# Patient Record
Sex: Male | Born: 1995 | Race: White | Hispanic: No | Marital: Married | Smoking: Never smoker
Health system: Southern US, Community
[De-identification: ages and names within clinical notes are randomized; demographics above are authoritative.]

## PROBLEM LIST (undated history)

## (undated) DIAGNOSIS — S82899A Other fracture of unspecified lower leg, initial encounter for closed fracture: Secondary | ICD-10-CM

---
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
## 2021-06-04 ENCOUNTER — Ambulatory Visit
Admission: EM | Admit: 2021-06-04 | Discharge: 2021-06-04 | Disposition: A | Discharge status: Home / Self Care | Payer: Medicaid Other | Attending: Family Medicine | Admitting: Family Medicine

## 2021-06-04 ENCOUNTER — Encounter: Payer: Self-pay | Admitting: Emergency Medicine

## 2021-06-04 DIAGNOSIS — J029 Acute pharyngitis, unspecified: Secondary | ICD-10-CM

## 2021-06-04 NOTE — ED Provider Notes (Signed)
  [facility]   [id number] 06/04/21 Arrival Time: 0805  ASSESSMENT & PLAN:  1. Sore throat    Improved. Work note provided.   Follow-up Information     [facility].   Specialty: Urgent Care Why: As needed. Contact information: [address], [address] [place] [address] [phone]                Reviewed expectations re: course of current medical issues. Questions answered. Outlined signs and symptoms indicating need for more acute intervention. Understanding verbalized. After Visit Summary given.   SUBJECTIVE: History from: patient. Jonathan Michael is a 25 y.o. male who reports ST 1-2 days, now better. No fever or cough. Needs note in order to return to work.     OBJECTIVE:  Vitals:   06/04/21 0825  BP: 128/79  Pulse: 69  Resp: 18  Temp: 97.6 F (36.4 C)  TempSrc: Oral  SpO2: 95%    General appearance: alert; no distress Eyes: PERRLA; EOMI; conjunctiva normal HENT: [place]; AT; without nasal congestion; no throat erythema; normal swallowing Neck: supple  Lungs: speaks full sentences without difficulty; unlabored Extremities: no edema Skin: warm and dry Neurologic: normal gait Psychological: alert and cooperative; normal mood and affect   No Known Allergies  Past Medical History:  Diagnosis Date   Ankle fracture    left   Social History   Socioeconomic History   Marital status: Single    Spouse name: Not on file   Number of children: Not on file   Years of education: Not on file   Highest education level: Not on file  Occupational History   Not on file  Tobacco Use   Smoking status: Never   Smokeless tobacco: Never  Substance and Sexual Activity   Alcohol use: Never   Drug use: Never   Sexual activity: Not on file  Other Topics Concern   Not on file  Social History Narrative   Not on file   Social Determinants of Health   Financial Resource Strain: Not on file   Food Insecurity: Not on file  Transportation Needs: Not on file  Physical Activity: Not on file  Stress: Not on file  Social Connections: Not on file  Intimate Partner Violence: Not on file   No family history on file. History reviewed. No pertinent surgical history.   Mardella Layman, MD 06/04/21 [phone]

## 2021-06-04 NOTE — ED Triage Notes (Signed)
Pt had to called out of work on Monday d/t Scratchy throat and throat pain with cough. Symptoms are gone today but pt needs note for work.

## 2022-09-02 ENCOUNTER — Ambulatory Visit
Admission: RE | Admit: 2022-09-02 | Discharge: 2022-09-02 | Disposition: A | Discharge status: Home / Self Care | Payer: BC Managed Care – PPO | Source: Ambulatory Visit | Attending: Nurse Practitioner | Admitting: Nurse Practitioner

## 2022-09-02 VITALS — BP 129/89 | HR 98 | Temp 97.8°F | Resp 18

## 2022-09-02 DIAGNOSIS — H9201 Otalgia, right ear: Secondary | ICD-10-CM | POA: Insufficient documentation

## 2022-09-02 DIAGNOSIS — Z20822 Contact with and (suspected) exposure to covid-19: Secondary | ICD-10-CM | POA: Diagnosis present

## 2022-09-02 DIAGNOSIS — H66011 Acute suppurative otitis media with spontaneous rupture of ear drum, right ear: Secondary | ICD-10-CM | POA: Diagnosis present

## 2022-09-02 MED ORDER — AMOXICILLIN 875 MG PO TABS: 875.0000 mg | ORAL_TABLET | Freq: Two times a day (BID) | ORAL | 0 refills | Status: AC

## 2022-09-02 NOTE — Discharge Instructions (Signed)
You have an ear infection.  Please take the amoxicillin 875 mg twice daily for 5 days to treat the ear infection.  Since the ear drum is open, avoid getting any water in your ear until it heals.  Follow-up with your primary doctor if your hearing does not improve after you finish treatment.

## 2022-09-02 NOTE — ED Triage Notes (Signed)
Nasal congestion, cough 3 days ago, right ear ringing since last night.  Has been taking sudafed

## 2022-09-02 NOTE — ED Provider Notes (Signed)
[facility]    CSN: [id number] Arrival date & time: 09/02/22  1602      History   Chief Complaint Chief Complaint  Patient presents with   Cough    ear pain - Entered by patient    HPI Jonathan Michael is a 26 y.o. male.   Patient presents for 3 days of dry cough, sore throat, sinus pressure, decreased appetite, and fatigue.  Reports last night, he developed severe right-sided ear pain that has eased off a little bit now.  Denies any ear drainage.  Reports both of his children are also sick.  Denies any significant fever, bodyaches, chills, shortness of breath or wheezing, chest pain or tightness, chest or nasal congestion, postnasal drainage, headache, abdominal pain, nausea/vomiting, diarrhea, and new rash.  Reports he took Sudafed for the past couple of days which did help with his symptoms temporarily.     Past Medical History:  Diagnosis Date   Ankle fracture    left    There are no problems to display for this patient.   History reviewed. No pertinent surgical history.     Home Medications    Prior to Admission medications   Medication Sig Start Date End Date Taking? Authorizing Provider  amoxicillin (AMOXIL) 875 MG tablet Take 1 tablet (875 mg total) by mouth 2 (two) times daily for 5 days. 09/02/22 09/07/22 Yes Valentino Nose, NP    Family History History reviewed. No pertinent family history.  Social History Social History   Tobacco Use   Smoking status: Never   Smokeless tobacco: Never  Substance Use Topics   Alcohol use: Never   Drug use: Never     Allergies   Patient has no known allergies.   Review of Systems Review of Systems Per HPI  Physical Exam Triage Vital Signs ED Triage Vitals  Enc Vitals Group     BP 09/02/22 1615 129/89     Pulse Rate 09/02/22 1615 98     Resp 09/02/22 1615 18     Temp 09/02/22 1615 97.8 F (36.6 C)     Temp Source 09/02/22 1615 Oral     SpO2 09/02/22 1615 97 %     Weight --       Height --      Head Circumference --      Peak Flow --      Pain Score 09/02/22 1616 0     Pain Loc --      Pain Edu? --      Excl. in [place]? --    No data found.  Updated Vital Signs BP 129/89 (BP Location: Right Arm)   Pulse 98   Temp 97.8 F (36.6 C) (Oral)   Resp 18   SpO2 97%   Visual Acuity Right Eye Distance:   Left Eye Distance:   Bilateral Distance:    Right Eye Near:   Left Eye Near:    Bilateral Near:     Physical Exam Vitals and nursing note reviewed.  Constitutional:      General: He is not in acute distress.    Appearance: Normal appearance. He is not toxic-appearing.  HENT:     Head: Normocephalic and atraumatic.     Right Ear: Decreased hearing noted. Drainage present. No swelling or tenderness. Tympanic membrane is injected, perforated, erythematous and bulging.     Left Ear: Hearing, tympanic membrane, ear canal and external ear normal. No decreased hearing noted. No drainage, swelling or tenderness.  Tympanic membrane is not injected, perforated, erythematous or bulging.     Nose: Nose normal. No congestion or rhinorrhea.     Mouth/Throat:     Mouth: Mucous membranes are moist.     Pharynx: Oropharynx is clear. Posterior oropharyngeal erythema present.  Eyes:     General: No scleral icterus.    Extraocular Movements: Extraocular movements intact.  Cardiovascular:     Rate and Rhythm: Normal rate and regular rhythm.  Pulmonary:     Effort: Pulmonary effort is normal. No respiratory distress.     Breath sounds: Normal breath sounds. No wheezing, rhonchi or rales.  Abdominal:     General: Abdomen is flat. Bowel sounds are normal. There is no distension.     Palpations: Abdomen is soft.     Tenderness: There is no abdominal tenderness. There is no right CVA tenderness or left CVA tenderness.  Musculoskeletal:     Cervical back: Normal range of motion.  Lymphadenopathy:     Cervical: No cervical adenopathy.  Skin:    General: Skin is warm and dry.      Capillary Refill: Capillary refill takes less than 2 seconds.     Coloration: Skin is not jaundiced or pale.     Findings: No erythema.  Neurological:     Mental Status: He is alert and oriented to person, place, and time.  Psychiatric:        Behavior: Behavior is cooperative.      UC Treatments / Results  Labs (all labs ordered are listed, but only abnormal results are displayed) Labs Reviewed  SARS CORONAVIRUS 2 (TAT 6-24 HRS)    EKG   Radiology No results found.  Procedures Procedures (including critical care time)  Medications Ordered in UC Medications - No data to display  Initial Impression / Assessment and Plan / UC Course  I have reviewed the triage vital signs and the nursing notes.  Pertinent labs & imaging results that were available during my care of the patient were reviewed by me and considered in my medical decision making (see chart for details).    Patient is well-appearing, normotensive, afebrile, not tachycardic, not tachypneic, oxygenating well on room air.  COVID-19 testing obtained, supportive care discussed.  On examination, he has otitis media of the right ear with tympanic membrane perforation.  We will treat with amoxicillin 870 mg twice daily for 5 days.  Supportive care discussed.  Ear precautions discussed.  Return and ER precautions discussed.  Encouraged follow-up if hearing does not improve with treatment.  Note given for work.  The patient was given the opportunity to ask questions.  All questions answered to their satisfaction.  The patient is in agreement to this plan.   Final Clinical Impressions(s) / UC Diagnoses   Final diagnoses:  Exposure to COVID-19 virus  Right ear pain  Non-recurrent acute suppurative otitis media of right ear with spontaneous rupture of tympanic membrane     Discharge Instructions      You have an ear infection.  Please take the amoxicillin 875 mg twice daily for 5 days to treat the ear infection.  Since  the ear drum is open, avoid getting any water in your ear until it heals.  Follow-up with your primary doctor if your hearing does not improve after you finish treatment.     ED Prescriptions     Medication Sig Dispense Auth. Provider   amoxicillin (AMOXIL) 875 MG tablet Take 1 tablet (875 mg total) by mouth 2 (  two) times daily for 5 days. 10 tablet Valentino Nose, NP      PDMP not reviewed this encounter.   Valentino Nose, NP 09/02/22 1652

## 2022-09-03 LAB — SARS CORONAVIRUS 2 (TAT 6-24 HRS): SARS Coronavirus 2: NEGATIVE

## 2022-10-20 ENCOUNTER — Ambulatory Visit
Admission: RE | Admit: 2022-10-20 | Discharge: 2022-10-20 | Disposition: A | Discharge status: Home / Self Care | Payer: BC Managed Care – PPO | Source: Ambulatory Visit | Attending: Nurse Practitioner | Admitting: Nurse Practitioner

## 2022-10-20 VITALS — BP 144/85 | HR 101 | Temp 98.9°F | Resp 18

## 2022-10-20 DIAGNOSIS — J069 Acute upper respiratory infection, unspecified: Secondary | ICD-10-CM | POA: Diagnosis present

## 2022-10-20 DIAGNOSIS — H6591 Unspecified nonsuppurative otitis media, right ear: Secondary | ICD-10-CM

## 2022-10-20 DIAGNOSIS — Z1152 Encounter for screening for COVID-19: Secondary | ICD-10-CM | POA: Diagnosis present

## 2022-10-20 LAB — RESP PANEL BY RT-PCR (FLU A&B, COVID) ARPGX2
Influenza A by PCR: NEGATIVE
Influenza B by PCR: NEGATIVE
SARS Coronavirus 2 by RT PCR: NEGATIVE

## 2022-10-20 MED ORDER — PROMETHAZINE-DM 6.25-15 MG/5ML PO SYRP: 5.0000 mL | ORAL_SOLUTION | Freq: Every evening | ORAL | 0 refills | Status: DC | PRN

## 2022-10-20 MED ORDER — FLUTICASONE PROPIONATE 50 MCG/ACT NA SUSP: 1.0000 | Freq: Every day | NASAL | 0 refills | Status: DC

## 2022-10-20 NOTE — Discharge Instructions (Signed)
You have a viral upper respiratory infection.  This should improve over the next week or so.  If your symptoms last longer than 10 days and do not improve, please follow-up for further evaluation.  If you develop chest pain or shortness of breath, go to the emergency room.  We have tested you today for COVID-19 and influenza you will see the results in Mychart and we will call you with positive results.    Please stay home and isolate until you are aware of the results.    Some things that can make you feel better are: - Increased rest - Increasing fluid with water/sugar free electrolytes - Acetaminophen and ibuprofen as needed for fever/pain - Salt water gargling, chloraseptic spray and throat lozenges - OTC guaifenesin (Mucinex) 600 mg twice daily  - Saline sinus flushes or a neti pot - Humidifying the air - Cough syrup at night time for dry cough  For the popping sensation in your right ear, start Flonase.

## 2022-10-20 NOTE — ED Triage Notes (Signed)
Chills and fevers since Friday night and haven't gotten better. I have a cough and never ending head ache and constantly cold. I belive have the flu but want to get checked out for covid and flu per patient.

## 2022-10-20 NOTE — ED Provider Notes (Signed)
[facility]    CSN: [id number] Arrival date & time: 10/20/22  [id number]      History   Chief Complaint Chief Complaint  Patient presents with   Cough    I have had chills and fevers since Friday night and haven't gotten better. I have a cough and never ending head ache and constantly cold. I belive I have the flu but want to get checked out for covid and flu - Entered by patient    HPI Jonathan Michael is a 26 y.o. male.   Patient presents for 3 days of chills, night sweats, mostly dry cough, nasal congestion, sore throat from coughing, sinus pressure, headache that is worse when coughing, right ear pressure, diarrhea, decreased appetite, fatigue, and weakness.  He denies shortness of breath, chest pain, chest congestion, runny nose, postnasal drainage, ear pain, nausea, vomiting, and abdominal pain.  No loss of taste or smell, no new rash.  Has tried increasing hydration, TheraFlu with minimal relief.    Past Medical History:  Diagnosis Date   Ankle fracture    left    There are no problems to display for this patient.   History reviewed. No pertinent surgical history.     Home Medications    Prior to Admission medications   Medication Sig Start Date End Date Taking? Authorizing Provider  fluticasone (FLONASE) 50 MCG/ACT nasal spray Place 1 spray into both nostrils daily. 10/20/22  Yes Eulogio Bear, NP  promethazine-dextromethorphan (PROMETHAZINE-DM) 6.25-15 MG/5ML syrup Take 5 mLs by mouth at bedtime as needed for cough. Do not take with alcohol or while driving or operating heavy machinery.  May cause drowsiness. 10/20/22  Yes Eulogio Bear, NP    Family History History reviewed. No pertinent family history.  Social History Social History   Tobacco Use   Smoking status: Never   Smokeless tobacco: Never  Substance Use Topics   Alcohol use: Never   Drug use: Never     Allergies   Patient has no known allergies.   Review of  Systems Review of Systems Per HPI  Physical Exam Triage Vital Signs ED Triage Vitals [10/20/22 1044]  Enc Vitals Group     BP (!) 144/85     Pulse Rate (!) 101     Resp 18     Temp 98.9 F (37.2 C)     Temp Source Oral     SpO2 96 %     Weight      Height      Head Circumference      Peak Flow      Pain Score 5     Pain Loc      Pain Edu?      Excl. in [place]?    No data found.  Updated Vital Signs BP (!) 144/85 (BP Location: Right Arm)   Pulse (!) 101   Temp 98.9 F (37.2 C) (Oral)   Resp 18   SpO2 96%   Visual Acuity Right Eye Distance:   Left Eye Distance:   Bilateral Distance:    Right Eye Near:   Left Eye Near:    Bilateral Near:     Physical Exam Vitals and nursing note reviewed.  Constitutional:      General: He is not in acute distress.    Appearance: Normal appearance. He is not ill-appearing or toxic-appearing.  HENT:     Head: Normocephalic and atraumatic.     Right Ear: Ear canal and external  ear normal. A middle ear effusion is present.     Left Ear: Tympanic membrane, ear canal and external ear normal.     Nose: No congestion or rhinorrhea.     Mouth/Throat:     Mouth: Mucous membranes are moist.     Pharynx: Oropharynx is clear. No oropharyngeal exudate or posterior oropharyngeal erythema.  Eyes:     General: No scleral icterus.    Extraocular Movements: Extraocular movements intact.  Cardiovascular:     Rate and Rhythm: Normal rate and regular rhythm.     Heart sounds: Normal heart sounds.  Pulmonary:     Effort: Pulmonary effort is normal. No respiratory distress.     Breath sounds: Normal breath sounds. No wheezing, rhonchi or rales.  Abdominal:     General: Abdomen is flat. Bowel sounds are normal. There is no distension.     Palpations: Abdomen is soft.     Tenderness: There is no abdominal tenderness. There is no guarding.  Musculoskeletal:     Cervical back: Normal range of motion and neck supple.  Lymphadenopathy:      Cervical: No cervical adenopathy.  Skin:    General: Skin is warm and dry.     Coloration: Skin is not jaundiced or pale.     Findings: No erythema or rash.  Neurological:     Mental Status: He is alert and oriented to person, place, and time.  Psychiatric:        Behavior: Behavior is cooperative.      UC Treatments / Results  Labs (all labs ordered are listed, but only abnormal results are displayed) Labs Reviewed  RESP PANEL BY RT-PCR (FLU A&B, COVID) [id number]    EKG   Radiology No results found.  Procedures Procedures (including critical care time)  Medications Ordered in UC Medications - No data to display  Initial Impression / Assessment and Plan / UC Course  I have reviewed the triage vital signs and the nursing notes.  Pertinent labs & imaging results that were available during my care of the patient were reviewed by me and considered in my medical decision making (see chart for details).   Patient is well-appearing, afebrile, not tachypneic, oxygenating well on room air.  He is mildly hypertensive and slightly tachycardic in triage, likely secondary to acute illness.  Encounter for screening for COVID-19 Viral URI with cough COVID-19, influenza testing obtained Supportive care discussed Start cough suppressants ER, return precautions discussed Note given for work  Middle ear effusion, right Start Flonase, recently had ruptured TM of same ear - suspect this will likely improve over next few weeks Consider ENT with no improvement  The patient was given the opportunity to ask questions.  All questions answered to their satisfaction.  The patient is in agreement to this plan.    Final Clinical Impressions(s) / UC Diagnoses   Final diagnoses:  Encounter for screening for COVID-19  Viral URI with cough  Middle ear effusion, right     Discharge Instructions      You have a viral upper respiratory infection.  This should improve over the next week or so.   If your symptoms last longer than 10 days and do not improve, please follow-up for further evaluation.  If you develop chest pain or shortness of breath, go to the emergency room.  We have tested you today for COVID-19 and influenza you will see the results in Mychart and we will call you with positive results.  Please stay home and isolate until you are aware of the results.    Some things that can make you feel better are: - Increased rest - Increasing fluid with water/sugar free electrolytes - Acetaminophen and ibuprofen as needed for fever/pain - Salt water gargling, chloraseptic spray and throat lozenges - OTC guaifenesin (Mucinex) 600 mg twice daily  - Saline sinus flushes or a neti pot - Humidifying the air - Cough syrup at night time for dry cough  For the popping sensation in your right ear, start Flonase.     ED Prescriptions     Medication Sig Dispense Auth. Provider   fluticasone (FLONASE) 50 MCG/ACT nasal spray Place 1 spray into both nostrils daily. 16 g Noemi Chapel A, NP   promethazine-dextromethorphan (PROMETHAZINE-DM) 6.25-15 MG/5ML syrup Take 5 mLs by mouth at bedtime as needed for cough. Do not take with alcohol or while driving or operating heavy machinery.  May cause drowsiness. 118 mL Eulogio Bear, NP      PDMP not reviewed this encounter.   Eulogio Bear, NP 10/20/22 1131

## 2022-10-26 ENCOUNTER — Ambulatory Visit: Payer: Self-pay

## 2022-10-26 ENCOUNTER — Ambulatory Visit
Admission: EM | Admit: 2022-10-26 | Discharge: 2022-10-26 | Disposition: A | Discharge status: Home / Self Care | Payer: BC Managed Care – PPO | Attending: Family Medicine | Admitting: Family Medicine

## 2022-10-26 DIAGNOSIS — J22 Unspecified acute lower respiratory infection: Secondary | ICD-10-CM

## 2022-10-26 MED ORDER — PROMETHAZINE-DM 6.25-15 MG/5ML PO SYRP: 5.0000 mL | ORAL_SOLUTION | Freq: Every evening | ORAL | 0 refills | Status: AC | PRN

## 2022-10-26 MED ORDER — ALBUTEROL SULFATE HFA 108 (90 BASE) MCG/ACT IN AERS: 2.0000 | INHALATION_SPRAY | RESPIRATORY_TRACT | 0 refills | Status: DC | PRN

## 2022-10-26 MED ORDER — AZITHROMYCIN 250 MG PO TABS: ORAL_TABLET | ORAL | 0 refills | Status: DC

## 2022-10-26 NOTE — ED Provider Notes (Signed)
[facility]    CSN: [id number] Arrival date & time: 10/26/22  1249      History   Chief Complaint No chief complaint on file.   HPI Jonathan Michael is a 26 y.o. male.   Presenting today following up on visit from 1 week ago for viral upper respiratory infection.  States his symptoms are significantly worsening over the past few days and his cough is persistent and he feels rattling in his chest.  Painful to swallow and is having nausea and vomiting additionally.  Taking Tylenol Cold and sinus now that his cough syrup is run out with minimal relief.  COVID flu testing was negative a week ago.    Past Medical History:  Diagnosis Date   Ankle fracture    left    There are no problems to display for this patient.   History reviewed. No pertinent surgical history.     Home Medications    Prior to Admission medications   Medication Sig Start Date End Date Taking? Authorizing Provider  albuterol (VENTOLIN HFA) 108 (90 Base) MCG/ACT inhaler Inhale 2 puffs into the lungs every 4 (four) hours as needed for wheezing or shortness of breath. 10/26/22  Yes Particia Nearing, PA-C  azithromycin (ZITHROMAX) 250 MG tablet Take first 2 tablets together, then 1 every day until finished. 10/26/22  Yes Particia Nearing, PA-C  fluticasone [facility]) 50 MCG/ACT nasal spray Place 1 spray into both nostrils daily. 10/20/22   Valentino Nose, NP  promethazine-dextromethorphan (PROMETHAZINE-DM) 6.25-15 MG/5ML syrup Take 5 mLs by mouth at bedtime as needed for cough. Do not take with alcohol or while driving or operating heavy machinery.  May cause drowsiness. 10/26/22   Particia Nearing, PA-C    Family History History reviewed. No pertinent family history.  Social History Social History   Tobacco Use   Smoking status: Never   Smokeless tobacco: Never  Substance Use Topics   Alcohol use: Never   Drug use: Never     Allergies   Patient has no known  allergies.   Review of Systems Review of Systems PER HPI  Physical Exam Triage Vital Signs ED Triage Vitals  Enc Vitals Group     BP 10/26/22 1311 (!) 142/84     Pulse Rate 10/26/22 1311 (!) 104     Resp 10/26/22 1311 20     Temp 10/26/22 1311 97.9 F (36.6 C)     Temp Source 10/26/22 1311 Oral     SpO2 10/26/22 1311 95 %     Weight --      Height --      Head Circumference --      Peak Flow --      Pain Score 10/26/22 1316 0     Pain Loc --      Pain Edu? --      Excl. in GC? --    No data found.  Updated Vital Signs BP (!) 142/84 (BP Location: Right Arm)   Pulse (!) 104   Temp 97.9 F (36.6 C) (Oral)   Resp 20   SpO2 95%   Visual Acuity Right Eye Distance:   Left Eye Distance:   Bilateral Distance:    Right Eye Near:   Left Eye Near:    Bilateral Near:     Physical Exam Vitals and nursing note reviewed.  Constitutional:      Appearance: He is well-developed.  HENT:     Head: Atraumatic.  Right Ear: External ear normal.     Left Ear: External ear normal.     Nose: Rhinorrhea present.     Mouth/Throat:     Pharynx: Posterior oropharyngeal erythema present. No oropharyngeal exudate.  Eyes:     Conjunctiva/sclera: Conjunctivae normal.     Pupils: Pupils are equal, round, and reactive to light.  Cardiovascular:     Rate and Rhythm: Normal rate and regular rhythm.  Pulmonary:     Effort: Pulmonary effort is normal. No respiratory distress.     Breath sounds: Wheezing present. No rales.     Comments: Trace wheezes b/l Musculoskeletal:        General: Normal range of motion.     Cervical back: Normal range of motion and neck supple.  Lymphadenopathy:     Cervical: No cervical adenopathy.  Skin:    General: Skin is warm and dry.  Neurological:     Mental Status: He is alert and oriented to person, place, and time.  Psychiatric:        Behavior: Behavior normal.      UC Treatments / Results  Labs (all labs ordered are listed, but only  abnormal results are displayed) Labs Reviewed - No data to display  EKG   Radiology No results found.  Procedures Procedures (including critical care time)  Medications Ordered in UC Medications - No data to display  Initial Impression / Assessment and Plan / UC Course  I have reviewed the triage vital signs and the nursing notes.  Pertinent labs & imaging results that were available during my care of the patient were reviewed by me and considered in my medical decision making (see chart for details).     Mildly tachycardic in triage, otherwise vital signs reassuring.  Given duration and worsening course, start azithromycin, refill Phenergan DM and albuterol as needed.  Discussed reported over-the-counter medications and home care.  Return for worsening symptoms.  Final Clinical Impressions(s) / UC Diagnoses   Final diagnoses:  Lower respiratory infection   Discharge Instructions   None    ED Prescriptions     Medication Sig Dispense Auth. Provider   promethazine-dextromethorphan (PROMETHAZINE-DM) 6.25-15 MG/5ML syrup Take 5 mLs by mouth at bedtime as needed for cough. Do not take with alcohol or while driving or operating heavy machinery.  May cause drowsiness. 118 mL Volney American, PA-C   azithromycin (ZITHROMAX) 250 MG tablet Take first 2 tablets together, then 1 every day until finished. 6 tablet Volney American, [place]   albuterol (VENTOLIN HFA) 108 (90 Base) MCG/ACT inhaler Inhale 2 puffs into the lungs every 4 (four) hours as needed for wheezing or shortness of breath. 18 g Volney American, [place]      PDMP not reviewed this encounter.   Volney American, [place] 10/26/22 1349

## 2022-10-26 NOTE — ED Triage Notes (Signed)
Pt reports since last visit (10/20/22)  Last night coughing was really bad, he threw up in the middle of the night. Whole household has a cough. Took tylenol today and gave no relief. Hurts to swallow. At night symptoms get worse   Pt ran out ofd cough medicine. Pt thought he would be feeling better dont know if he can go to work tomorrow,

## 2022-10-28 ENCOUNTER — Other Ambulatory Visit: Payer: Self-pay

## 2022-10-28 ENCOUNTER — Encounter (HOSPITAL_COMMUNITY): Payer: Self-pay

## 2022-10-28 ENCOUNTER — Emergency Department (HOSPITAL_COMMUNITY)
Admission: EM | Admit: 2022-10-28 | Discharge: 2022-10-28 | Disposition: A | Discharge status: Home / Self Care | Payer: BC Managed Care – PPO | Attending: Emergency Medicine | Admitting: Emergency Medicine

## 2022-10-28 DIAGNOSIS — Z79899 Other long term (current) drug therapy: Secondary | ICD-10-CM | POA: Diagnosis not present

## 2022-10-28 DIAGNOSIS — R197 Diarrhea, unspecified: Secondary | ICD-10-CM | POA: Diagnosis present

## 2022-10-28 DIAGNOSIS — I10 Essential (primary) hypertension: Secondary | ICD-10-CM | POA: Insufficient documentation

## 2022-10-28 DIAGNOSIS — R000 Tachycardia, unspecified: Secondary | ICD-10-CM | POA: Diagnosis not present

## 2022-10-28 DIAGNOSIS — U071 COVID-19: Secondary | ICD-10-CM | POA: Diagnosis not present

## 2022-10-28 LAB — CBC
HCT: 49.3 % (ref 39.0–52.0)
Hemoglobin: 17.8 g/dL — ABNORMAL HIGH (ref 13.0–17.0)
MCH: 31.3 pg (ref 26.0–34.0)
MCHC: 36.1 g/dL — ABNORMAL HIGH (ref 30.0–36.0)
MCV: 86.8 fL (ref 80.0–100.0)
Platelets: 368 10*3/uL (ref 150–400)
RBC: 5.68 MIL/uL (ref 4.22–5.81)
RDW: 12.3 % (ref 11.5–15.5)
WBC: 11.5 10*3/uL — ABNORMAL HIGH (ref 4.0–10.5)
nRBC: 0 % (ref 0.0–0.2)

## 2022-10-28 LAB — SARS CORONAVIRUS 2 BY RT PCR: SARS Coronavirus 2 by RT PCR: POSITIVE — AB

## 2022-10-28 LAB — COMPREHENSIVE METABOLIC PANEL
ALT: 30 U/L (ref 0–44)
AST: 20 U/L (ref 15–41)
Albumin: 4.6 g/dL (ref 3.5–5.0)
Alkaline Phosphatase: 83 U/L (ref 38–126)
Anion gap: 11 (ref 5–15)
BUN: 14 mg/dL (ref 6–20)
CO2: 24 mmol/L (ref 22–32)
Calcium: 9.5 mg/dL (ref 8.9–10.3)
Chloride: 103 mmol/L (ref 98–111)
Creatinine, Ser: 0.95 mg/dL (ref 0.61–1.24)
GFR, Estimated: >60 mL/min (ref >60)
Glucose, Bld: 102 mg/dL — ABNORMAL HIGH (ref 70–99)
Potassium: 3.7 mmol/L (ref 3.5–5.1)
Sodium: 138 mmol/L (ref 135–145)
Total Bilirubin: 0.9 mg/dL (ref 0.3–1.2)
Total Protein: 7.8 g/dL (ref 6.5–8.1)

## 2022-10-28 LAB — LIPASE, BLOOD: Lipase: 30 U/L (ref 11–51)

## 2022-10-28 MED ORDER — ONDANSETRON HCL 4 MG/2ML IJ SOLN
4.0000 mg | Freq: Once | INTRAMUSCULAR | Status: AC
  Administered 2022-10-28: 4 mg via INTRAVENOUS
  Filled 2022-10-28: qty 2

## 2022-10-28 MED ORDER — SODIUM CHLORIDE 0.9 % IV BOLUS
1000.0000 mL | Freq: Once | INTRAVENOUS | Status: AC
  Administered 2022-10-28: 1000 mL via INTRAVENOUS

## 2022-10-28 MED ORDER — ONDANSETRON 4 MG PO TBDP: 4.0000 mg | ORAL_TABLET | Freq: Three times a day (TID) | ORAL | 0 refills | Status: AC | PRN

## 2022-10-28 MED ORDER — LOPERAMIDE HCL 2 MG PO CAPS: 2.0000 mg | ORAL_CAPSULE | Freq: Four times a day (QID) | ORAL | 0 refills | Status: AC | PRN

## 2022-10-28 MED ORDER — LACTATED RINGERS IV BOLUS
1000.0000 mL | Freq: Once | INTRAVENOUS | Status: AC
Start: 2022-10-28 — End: 2022-10-28
  Administered 2022-10-28: 1000 mL via INTRAVENOUS

## 2022-10-28 MED ORDER — LOPERAMIDE HCL 2 MG PO CAPS
2.0000 mg | ORAL_CAPSULE | Freq: Once | ORAL | Status: AC
  Administered 2022-10-28: 2 mg via ORAL
  Filled 2022-10-28: qty 1

## 2022-10-28 NOTE — Discharge Instructions (Addendum)
Stay well-hydrated, your condition carefully and do not hesitate to return here for concerning changes in your condition.

## 2022-10-28 NOTE — ED Notes (Signed)
An After Visit Summary was printed and given to the patient. Discharge instructions given and no further questions at this time.  

## 2022-10-28 NOTE — ED Triage Notes (Signed)
Pt to er, pt states that he has had diarrhea for the past week.  States that he went to urgent care on 27th and he was tested for covid and it was negative, states that five days after that he started having the vomiting and diarrhea.  States that he took an at home covid test and it was positive, pt states that he is here because he continues to have the vomiting and diarrhea.

## 2022-10-28 NOTE — ED Provider Notes (Signed)
[facility] Provider Note   CSN: [id number] Arrival date & time: 10/28/22  0831     History  Chief Complaint  Patient presents with   Diarrhea    Jonathan Michael is a 26 y.o. male.  HPI Patient presents with concern of ongoing nausea vomiting and diarrhea.  Patient is generally well takes no medication regularly.  Illness began about 1 week ago, at which point one of his family members had a cough.  He had similar URI-like illness, but subsequently developed nausea, vomiting, diarrhea and rapid transport, GI without abdominal pain, without chest pain.  No fever.  He was seen and evaluated in urgent care, started azithromycin.  He had 1 negative COVID test, then 1 reportedly positive test.  His wife has also tested positive for COVID.    Home Medications Prior to Admission medications   Medication Sig Start Date End Date Taking? Authorizing Provider  loperamide (IMODIUM) 2 MG capsule Take 1 capsule (2 mg total) by mouth 4 (four) times daily as needed for diarrhea or loose stools. 10/28/22  Yes Gerhard Munch, MD  ondansetron (ZOFRAN-ODT) 4 MG disintegrating tablet Take 1 tablet (4 mg total) by mouth every 8 (eight) hours as needed for nausea or vomiting. 10/28/22  Yes Gerhard Munch, MD  albuterol (VENTOLIN HFA) 108 (90 Base) MCG/ACT inhaler Inhale 2 puffs into the lungs every 4 (four) hours as needed for wheezing or shortness of breath. 10/26/22   Particia Nearing, PA-C  azithromycin (ZITHROMAX) 250 MG tablet Take first 2 tablets together, then 1 every day until finished. 10/26/22   Particia Nearing, PA-C  fluticasone [facility]) 50 MCG/ACT nasal spray Place 1 spray into both nostrils daily. 10/20/22   Valentino Nose, NP  promethazine-dextromethorphan (PROMETHAZINE-DM) 6.25-15 MG/5ML syrup Take 5 mLs by mouth at bedtime as needed for cough. Do not take with alcohol or while driving or operating heavy machinery.  May cause drowsiness.  10/26/22   Particia Nearing, PA-C      Allergies    Patient has no known allergies.    Review of Systems   Review of Systems  All other systems reviewed and are negative.   Physical Exam Updated Vital Signs BP 126/83   Pulse 86   Temp 97.9 F (36.6 C) (Oral)   Resp 18   Ht 6' (1.829 m)   Wt 99.8 kg   SpO2 94%   BMI 29.84 kg/m  Physical Exam Vitals and nursing note reviewed.  Constitutional:      General: He is not in acute distress.    Appearance: He is well-developed.  HENT:     Head: Normocephalic and atraumatic.  Eyes:     Conjunctiva/sclera: Conjunctivae normal.  Cardiovascular:     Rate and Rhythm: Regular rhythm. Tachycardia present.  Pulmonary:     Effort: Pulmonary effort is normal. No respiratory distress.     Breath sounds: No stridor.  Abdominal:     General: There is no distension.  Skin:    General: Skin is warm and dry.  Neurological:     Mental Status: He is alert and oriented to person, place, and time.     ED Results / Procedures / Treatments   Labs (all labs ordered are listed, but only abnormal results are displayed) Labs Reviewed  SARS CORONAVIRUS 2 BY RT PCR - Abnormal; Notable for the following components:      Result Value   SARS Coronavirus 2 by RT PCR POSITIVE (*)  All other components within normal limits  COMPREHENSIVE METABOLIC PANEL - Abnormal; Notable for the following components:   Glucose, Bld 102 (*)    All other components within normal limits  CBC - Abnormal; Notable for the following components:   WBC 11.5 (*)    Hemoglobin 17.8 (*)    MCHC 36.1 (*)    All other components within normal limits  LIPASE, BLOOD  URINALYSIS, ROUTINE W REFLEX MICROSCOPIC    EKG None  Radiology No results found.  Procedures Procedures    Medications Ordered in ED Medications  sodium chloride 0.9 % bolus 1,000 mL (0 mLs Intravenous Stopped 10/28/22 1130)  ondansetron (ZOFRAN) injection 4 mg (4 mg Intravenous Given  10/28/22 1009)  lactated ringers bolus 1,000 mL (1,000 mLs Intravenous New Bag/Given 10/28/22 1131)  loperamide (IMODIUM) capsule 2 mg (2 mg Oral Given 10/28/22 1016)    ED Course/ Medical Decision Making/ A&P                           Medical Decision Making Previously well adult male now with 1 week of illness including nausea, vomiting, diarrhea.  Patient found to be tachycardic, mildly hypertensive, but afebrile, awake, alert, speaking clearly.  Patient has a nonperitoneal abdominal exam, low suspicion for bacteremia sepsis.  Some suspicion for dehydration given his persistent nausea, vomiting, diarrhea.  This may be secondary to COVID.  Labs fluid resuscitation antiemetics started after evaluation.  Amount and/or Complexity of Data Reviewed Labs: ordered. Decision-making details documented in ED Course. Radiology:  Decision-making details documented in ED Course. ECG/medicine tests:  Decision-making details documented in ED Course.  Risk OTC drugs. Prescription drug management. Decision regarding hospitalization.   This adult male is presenting with diarrhea, nausea, vomiting, with possible infectious etiology.  Patient is awake, alert, tachycardic on arrival.  Patient improved with fluid resuscitation was able to continue resuscitation orally.  Patient had nonperitoneal abdomen was afebrile, awake, alert.  On repeat exam patient was accompanied by his wife we discussed all findings at length, including need for monitoring, follow-up as needed.  Absent evidence for bacteremia, sepsis, with improvement here as above, patient appropriate for discharge with close outpatient follow-up.        Final Clinical Impression(s) / ED Diagnoses Final diagnoses:  COVID    Rx / DC Orders ED Discharge Orders          Ordered    ondansetron (ZOFRAN-ODT) 4 MG disintegrating tablet  Every 8 hours PRN        10/28/22 1251    loperamide (IMODIUM) 2 MG capsule  4 times daily PRN        10/28/22  1251              Carmin Muskrat, MD 10/28/22 1253

## 2022-11-11 ENCOUNTER — Other Ambulatory Visit: Payer: Self-pay | Admitting: Nurse Practitioner

## 2022-11-11 NOTE — Telephone Encounter (Signed)
Unable to refill per protocol, last refill by another provider. Provider not at this practice will refuse.  Requested Prescriptions  Pending Prescriptions Disp Refills   fluticasone (FLONASE) 50 MCG/ACT nasal spray [Pharmacy Med Name: FLUTICASONE PROP 50 MCG SPRAY] 48 mL 1    Sig: SPRAY 1 SPRAY INTO BOTH NOSTRILS DAILY.     Ear, Nose, and Throat: Nasal Preparations - Corticosteroids Failed - 11/11/2022  2:32 PM      Failed - Valid encounter within last 12 months    Recent Outpatient Visits   None

## 2022-11-17 ENCOUNTER — Other Ambulatory Visit: Payer: Self-pay | Admitting: Family Medicine

## 2022-11-18 NOTE — Telephone Encounter (Signed)
Requested Prescriptions  Pending Prescriptions Disp Refills   VENTOLIN HFA 108 (90 Base) MCG/ACT inhaler [Pharmacy Med Name: VENTOLIN HFA 90 MCG INHALER] 18 each     Sig: INHALE 2 PUFFS INTO THE LUNGS EVERY 4 HOURS AS NEEDED FOR WHEEZING OR SHORTNESS OF BREATH.     There is no refill protocol information for this order      

## 2023-02-04 ENCOUNTER — Ambulatory Visit
Admission: RE | Admit: 2023-02-04 | Discharge: 2023-02-04 | Disposition: A | Discharge status: Home / Self Care | Payer: BC Managed Care – PPO | Source: Ambulatory Visit | Attending: Family Medicine | Admitting: Family Medicine

## 2023-02-04 VITALS — BP 128/81 | HR 95 | Temp 97.7°F | Resp 18

## 2023-02-04 DIAGNOSIS — Z1152 Encounter for screening for COVID-19: Secondary | ICD-10-CM | POA: Diagnosis not present

## 2023-02-04 DIAGNOSIS — R059 Cough, unspecified: Secondary | ICD-10-CM | POA: Insufficient documentation

## 2023-02-04 DIAGNOSIS — J069 Acute upper respiratory infection, unspecified: Secondary | ICD-10-CM | POA: Insufficient documentation

## 2023-02-04 MED ORDER — LIDOCAINE VISCOUS HCL 2 % MT SOLN: 10.0000 mL | OROMUCOSAL | 0 refills | Status: AC | PRN

## 2023-02-04 MED ORDER — PROMETHAZINE-DM 6.25-15 MG/5ML PO SYRP: 5.0000 mL | ORAL_SOLUTION | Freq: Four times a day (QID) | ORAL | 0 refills | Status: AC | PRN

## 2023-02-04 NOTE — ED Triage Notes (Signed)
Sore throat, cough since last night. Some nasal congestion

## 2023-02-04 NOTE — ED Provider Notes (Signed)
[facility]    CSN: [id number] Arrival date & time: 02/04/23  1536      History   Chief Complaint Chief Complaint  Patient presents with   Sore Throat    Entered by patient    HPI Jonathan Michael is a 27 y.o. male.   Patient presenting today with 1 day history of sore throat, congestion, cough.  Denies fever, chills, chest pain, shortness of breath, abdominal pain, nausea vomiting or diarrhea.  So far took some children's DayQuil with minimal relief.  No known sick contacts recently.  No known pertinent chronic medical problems.    Past Medical History:  Diagnosis Date   Ankle fracture    left    There are no problems to display for this patient.   History reviewed. No pertinent surgical history.     Home Medications    Prior to Admission medications   Medication Sig Start Date End Date Taking? Authorizing Provider  lidocaine (XYLOCAINE) 2 % solution Use as directed 10 mLs in the mouth or throat every 3 (three) hours as needed for mouth pain. 02/04/23  Yes Volney American, PA-C  promethazine-dextromethorphan (PROMETHAZINE-DM) 6.25-15 MG/5ML syrup Take 5 mLs by mouth 4 (four) times daily as needed. 02/04/23  Yes Volney American, PA-C  albuterol (VENTOLIN HFA) 108 (90 Base) MCG/ACT inhaler Inhale 2 puffs into the lungs every 4 (four) hours as needed for wheezing or shortness of breath. 10/26/22   Volney American, PA-C  azithromycin (ZITHROMAX) 250 MG tablet Take first 2 tablets together, then 1 every day until finished. 10/26/22   Volney American, PA-C  fluticasone [facility]) 50 MCG/ACT nasal spray Place 1 spray into both nostrils daily. 10/20/22   Eulogio Bear, NP  loperamide (IMODIUM) 2 MG capsule Take 1 capsule (2 mg total) by mouth 4 (four) times daily as needed for diarrhea or loose stools. 10/28/22   Carmin Muskrat, MD  ondansetron (ZOFRAN-ODT) 4 MG disintegrating tablet Take 1 tablet (4 mg total) by mouth every 8  (eight) hours as needed for nausea or vomiting. 10/28/22   Carmin Muskrat, MD  promethazine-dextromethorphan (PROMETHAZINE-DM) 6.25-15 MG/5ML syrup Take 5 mLs by mouth at bedtime as needed for cough. Do not take with alcohol or while driving or operating heavy machinery.  May cause drowsiness. 10/26/22   Volney American, PA-C    Family History History reviewed. No pertinent family history.  Social History Social History   Tobacco Use   Smoking status: Never   Smokeless tobacco: Never  Vaping Use   Vaping Use: Never used  Substance Use Topics   Alcohol use: Never   Drug use: Never     Allergies   Patient has no known allergies.   Review of Systems Review of Systems Per HPI  Physical Exam Triage Vital Signs ED Triage Vitals  Enc Vitals Group     BP 02/04/23 1541 128/81     Pulse Rate 02/04/23 1541 95     Resp 02/04/23 1541 18     Temp 02/04/23 1541 97.7 F (36.5 C)     Temp Source 02/04/23 1541 Oral     SpO2 02/04/23 1541 95 %     Weight --      Height --      Head Circumference --      Peak Flow --      Pain Score 02/04/23 1542 4     Pain Loc --      Pain Edu? --  Excl. in GC? --    No data found.  Updated Vital Signs BP 128/81 (BP Location: Right Arm)   Pulse 95   Temp 97.7 F (36.5 C) (Oral)   Resp 18   SpO2 95%   Visual Acuity Right Eye Distance:   Left Eye Distance:   Bilateral Distance:    Right Eye Near:   Left Eye Near:    Bilateral Near:     Physical Exam Vitals and nursing note reviewed.  Constitutional:      Appearance: He is well-developed.  HENT:     Head: Atraumatic.     Right Ear: External ear normal.     Left Ear: External ear normal.     Nose: Rhinorrhea present.     Mouth/Throat:     Pharynx: Posterior oropharyngeal erythema present. No oropharyngeal exudate.  Eyes:     Conjunctiva/sclera: Conjunctivae normal.     Pupils: Pupils are equal, round, and reactive to light.  Cardiovascular:     Rate and Rhythm:  Normal rate and regular rhythm.  Pulmonary:     Effort: Pulmonary effort is normal. No respiratory distress.     Breath sounds: No wheezing or rales.  Musculoskeletal:        General: Normal range of motion.     Cervical back: Normal range of motion and neck supple.  Lymphadenopathy:     Cervical: No cervical adenopathy.  Skin:    General: Skin is warm and dry.  Neurological:     Mental Status: He is alert and oriented to person, place, and time.  Psychiatric:        Behavior: Behavior normal.      UC Treatments / Results  Labs (all labs ordered are listed, but only abnormal results are displayed) Labs Reviewed  SARS CORONAVIRUS 2 (TAT 6-24 HRS)    EKG   Radiology No results found.  Procedures Procedures (including critical care time)  Medications Ordered in UC Medications - No data to display  Initial Impression / Assessment and Plan / UC Course  I have reviewed the triage vital signs and the nursing notes.  Pertinent labs & imaging results that were available during my care of the patient were reviewed by me and considered in my medical decision making (see chart for details).     Vital signs and exam overall reassuring today and suggestive of a viral upper respiratory infection.  COVID testing pending.  Treat with viscous lidocaine, Phenergan DM, supportive over-the-counter medications and home care.  Work note given.  Return for worsening symptoms.  Final Clinical Impressions(s) / UC Diagnoses   Final diagnoses:  Viral URI with cough   Discharge Instructions   None    ED Prescriptions     Medication Sig Dispense Auth. Provider   promethazine-dextromethorphan (PROMETHAZINE-DM) 6.25-15 MG/5ML syrup Take 5 mLs by mouth 4 (four) times daily as needed. 100 mL Volney American, PA-C   lidocaine (XYLOCAINE) 2 % solution Use as directed 10 mLs in the mouth or throat every 3 (three) hours as needed for mouth pain. 100 mL Volney American, [place]       PDMP not reviewed this encounter.   Volney American, [place] 02/04/23 1616

## 2023-02-05 LAB — SARS CORONAVIRUS 2 (TAT 6-24 HRS): SARS Coronavirus 2: NEGATIVE

## 2023-02-07 ENCOUNTER — Ambulatory Visit
Admission: EM | Admit: 2023-02-07 | Discharge: 2023-02-07 | Disposition: A | Discharge status: Home / Self Care | Payer: BC Managed Care – PPO | Attending: Nurse Practitioner | Admitting: Nurse Practitioner

## 2023-02-07 DIAGNOSIS — H6641 Suppurative otitis media, unspecified, right ear: Secondary | ICD-10-CM | POA: Diagnosis not present

## 2023-02-07 MED ORDER — FLUTICASONE PROPIONATE 50 MCG/ACT NA SUSP: 2.0000 | Freq: Every day | NASAL | 0 refills | Status: AC

## 2023-02-07 MED ORDER — AMOXICILLIN 500 MG PO CAPS: 500.0000 mg | ORAL_CAPSULE | Freq: Two times a day (BID) | ORAL | 0 refills | Status: AC

## 2023-02-07 MED ORDER — CETIRIZINE HCL 10 MG PO TABS: 10.0000 mg | ORAL_TABLET | Freq: Every day | ORAL | 0 refills | Status: AC

## 2023-02-07 NOTE — ED Provider Notes (Signed)
[facility]    CSN: [id number] Arrival date & time: 02/07/23  1118      History   Chief Complaint Chief Complaint  Patient presents with   Ear Problem    HPI Jonathan Michael is a 27 y.o. male.   The history is provided by the patient.   The patient presents for complaints of right ear pain that been present for the past 24 hours.  Patient states last night after laying down, his right ear "went out".  He states that he has had decreased hearing in the ear along with mild ear pain.  Patient reports recent upper respiratory symptoms, he was seen in this clinic on 02/04/2023.  Patient denies new fever, chills, headache, ear drainage, cough, abdominal pain, nausea, vomiting, or diarrhea.  Patient reports that he still has some nasal congestion present.  Denies history of recurrent ear infections.  Past Medical History:  Diagnosis Date   Ankle fracture    left    There are no problems to display for this patient.   History reviewed. No pertinent surgical history.     Home Medications    Prior to Admission medications   Medication Sig Start Date End Date Taking? Authorizing Provider  amoxicillin (AMOXIL) 500 MG capsule Take 1 capsule (500 mg total) by mouth 2 (two) times daily for 10 days. 02/07/23 02/17/23 Yes Hiral Lukasiewicz-Warren, Alda Lea, NP  cetirizine (ZYRTEC) 10 MG tablet Take 1 tablet (10 mg total) by mouth daily. 02/07/23  Yes Kaydynce Pat-Warren, Alda Lea, NP  fluticasone (FLONASE) 50 MCG/ACT nasal spray Place 2 sprays into both nostrils daily. 02/07/23  Yes Janeann Paisley-Warren, Alda Lea, NP  albuterol (VENTOLIN HFA) 108 (90 Base) MCG/ACT inhaler Inhale 2 puffs into the lungs every 4 (four) hours as needed for wheezing or shortness of breath. 10/26/22   Volney American, PA-C  azithromycin (ZITHROMAX) 250 MG tablet Take first 2 tablets together, then 1 every day until finished. 10/26/22   Volney American, PA-C  lidocaine (XYLOCAINE) 2 % solution Use as directed  10 mLs in the mouth or throat every 3 (three) hours as needed for mouth pain. 02/04/23   Volney American, PA-C  loperamide (IMODIUM) 2 MG capsule Take 1 capsule (2 mg total) by mouth 4 (four) times daily as needed for diarrhea or loose stools. 10/28/22   Carmin Muskrat, MD  ondansetron (ZOFRAN-ODT) 4 MG disintegrating tablet Take 1 tablet (4 mg total) by mouth every 8 (eight) hours as needed for nausea or vomiting. 10/28/22   Carmin Muskrat, MD  promethazine-dextromethorphan (PROMETHAZINE-DM) 6.25-15 MG/5ML syrup Take 5 mLs by mouth at bedtime as needed for cough. Do not take with alcohol or while driving or operating heavy machinery.  May cause drowsiness. 10/26/22   Volney American, PA-C  promethazine-dextromethorphan (PROMETHAZINE-DM) 6.25-15 MG/5ML syrup Take 5 mLs by mouth 4 (four) times daily as needed. 02/04/23   Volney American, PA-C    Family History History reviewed. No pertinent family history.  Social History Social History   Tobacco Use   Smoking status: Never   Smokeless tobacco: Never  Vaping Use   Vaping Use: Never used  Substance Use Topics   Alcohol use: Never   Drug use: Never     Allergies   Patient has no known allergies.   Review of Systems Review of Systems Per HPI  Physical Exam Triage Vital Signs ED Triage Vitals  Enc Vitals Group     BP 02/07/23 1223 127/81  Pulse Rate 02/07/23 1223 63     Resp 02/07/23 1223 16     Temp 02/07/23 1223 98.2 F (36.8 C)     Temp Source 02/07/23 1223 Oral     SpO2 02/07/23 1223 98 %     Weight --      Height --      Head Circumference --      Peak Flow --      Pain Score 02/07/23 1225 0     Pain Loc --      Pain Edu? --      Excl. in [place]? --    No data found.  Updated Vital Signs BP 127/81 (BP Location: Right Arm)   Pulse 63   Temp 98.2 F (36.8 C) (Oral)   Resp 16   SpO2 98%   Visual Acuity Right Eye Distance:   Left Eye Distance:   Bilateral Distance:    Right Eye Near:    Left Eye Near:    Bilateral Near:     Physical Exam Vitals and nursing note reviewed.  Constitutional:      General: He is not in acute distress.    Appearance: Normal appearance.  HENT:     Head: Normocephalic.     Right Ear: Ear canal and external ear normal. A middle ear effusion is present. Tympanic membrane is erythematous and bulging.     Left Ear: Ear canal and external ear normal. A middle ear effusion is present. Tympanic membrane is not erythematous or bulging.     Nose: Congestion present.     Mouth/Throat:     Mouth: Mucous membranes are moist.     Pharynx: No posterior oropharyngeal erythema.  Eyes:     Extraocular Movements: Extraocular movements intact.     Pupils: Pupils are equal, round, and reactive to light.  Cardiovascular:     Rate and Rhythm: Normal rate and regular rhythm.     Pulses: Normal pulses.     Heart sounds: Normal heart sounds.  Pulmonary:     Effort: Pulmonary effort is normal. No respiratory distress.     Breath sounds: Normal breath sounds. No stridor. No wheezing, rhonchi or rales.  Abdominal:     General: Bowel sounds are normal.     Palpations: Abdomen is soft.     Tenderness: There is no abdominal tenderness.  Musculoskeletal:     Cervical back: Normal range of motion.  Lymphadenopathy:     Cervical: No cervical adenopathy.  Neurological:     General: No focal deficit present.     Mental Status: He is alert and oriented to person, place, and time.  Psychiatric:        Mood and Affect: Mood normal.        Behavior: Behavior normal.      UC Treatments / Results  Labs (all labs ordered are listed, but only abnormal results are displayed) Labs Reviewed - No data to display  EKG   Radiology No results found.  Procedures Procedures (including critical care time)  Medications Ordered in UC Medications - No data to display  Initial Impression / Assessment and Plan / UC Course  I have reviewed the triage vital signs and  the nursing notes.  Pertinent labs & imaging results that were available during my care of the patient were reviewed by me and considered in my medical decision making (see chart for details).  Patient is well-appearing, he is in no acute distress, vital signs are stable.  On exam, patient with bulging and erythema of the right tympanic membrane with moderate effusion present.  Symptoms are consistent with otitis media.  Will treat with amoxicillin 500 mg twice daily for 10 days, for the middle ear effusion, fluticasone 50 mcg nasal spray was prescribed.  Supportive care recommendations were provided to the patient along with indications of when follow-up may be necessary.  Patient verbalizes understanding.  All questions were answered.  Patient stable for discharge.   Final Clinical Impressions(s) / UC Diagnoses   Final diagnoses:  Suppurative otitis media of right ear, unspecified chronicity     Discharge Instructions      Take medication as prescribed. May take ibuprofen or Tylenol as needed for pain, fever, general discomfort. Warm compresses to the affected ear help with comfort. Do not stick anything inside the ear while symptoms persist. Avoid getting water inside of the ear while symptoms persist. Symptoms do not improve after you have completed this treatment, please follow-up in this clinic or with your primary care physician for further evaluation. Follow-up as needed.     ED Prescriptions     Medication Sig Dispense Auth. Provider   amoxicillin (AMOXIL) 500 MG capsule Take 1 capsule (500 mg total) by mouth 2 (two) times daily for 10 days. 20 capsule Freada Twersky-Warren, Alda Lea, NP   fluticasone (FLONASE) 50 MCG/ACT nasal spray Place 2 sprays into both nostrils daily. 16 g Sajid Ruppert-Warren, Alda Lea, NP   cetirizine (ZYRTEC) 10 MG tablet Take 1 tablet (10 mg total) by mouth daily. 30 tablet Afreen Siebels-Warren, Alda Lea, NP      PDMP not reviewed this encounter.    Tish Men, NP 02/07/23 1258

## 2023-02-07 NOTE — ED Triage Notes (Signed)
Pt reports hearing decrease right ear x 1 day. Pt reports he had ear drum rupture couple months ago.

## 2023-02-07 NOTE — Discharge Instructions (Addendum)
Take medication as prescribed. May take ibuprofen or Tylenol as needed for pain, fever, general discomfort. Warm compresses to the affected ear help with comfort. Do not stick anything inside the ear while symptoms persist. Avoid getting water inside of the ear while symptoms persist. Symptoms do not improve after you have completed this treatment, please follow-up in this clinic or with your primary care physician for further evaluation. Follow-up as needed.

## 2024-01-13 ENCOUNTER — Other Ambulatory Visit: Payer: Self-pay

## 2024-01-13 ENCOUNTER — Emergency Department (HOSPITAL_COMMUNITY)
Admission: EM | Admit: 2024-01-13 | Discharge: 2024-01-13 | Disposition: A | Discharge status: Home / Self Care | Payer: BC Managed Care – PPO | Attending: Emergency Medicine | Admitting: Emergency Medicine

## 2024-01-13 ENCOUNTER — Encounter (HOSPITAL_COMMUNITY): Payer: Self-pay

## 2024-01-13 DIAGNOSIS — R1013 Epigastric pain: Secondary | ICD-10-CM | POA: Diagnosis present

## 2024-01-13 LAB — CBC
HCT: 46.6 % (ref 39.0–52.0)
Hemoglobin: 16.1 g/dL (ref 13.0–17.0)
MCH: 30.2 pg (ref 26.0–34.0)
MCHC: 34.5 g/dL (ref 30.0–36.0)
MCV: 87.4 fL (ref 80.0–100.0)
Platelets: 239 10*3/uL (ref 150–400)
RBC: 5.33 MIL/uL (ref 4.22–5.81)
RDW: 11.9 % (ref 11.5–15.5)
WBC: 7.3 10*3/uL (ref 4.0–10.5)
nRBC: 0 % (ref 0.0–0.2)

## 2024-01-13 LAB — COMPREHENSIVE METABOLIC PANEL
ALT: 24 U/L (ref 0–44)
AST: 19 U/L (ref 15–41)
Albumin: 4.7 g/dL (ref 3.5–5.0)
Alkaline Phosphatase: 56 U/L (ref 38–126)
Anion gap: 7 (ref 5–15)
BUN: 10 mg/dL (ref 6–20)
CO2: 24 mmol/L (ref 22–32)
Calcium: 9.7 mg/dL (ref 8.9–10.3)
Chloride: 105 mmol/L (ref 98–111)
Creatinine, Ser: 0.82 mg/dL (ref 0.61–1.24)
GFR, Estimated: >60 mL/min (ref >60)
Glucose, Bld: 108 mg/dL — ABNORMAL HIGH (ref 70–99)
Potassium: 4.1 mmol/L (ref 3.5–5.1)
Sodium: 136 mmol/L (ref 135–145)
Total Bilirubin: 0.9 mg/dL (ref 0.0–1.2)
Total Protein: 8 g/dL (ref 6.5–8.1)

## 2024-01-13 LAB — LIPASE, BLOOD: Lipase: 32 U/L (ref 11–51)

## 2024-01-13 MED ORDER — SUCRALFATE 1 G PO TABS: 1.0000 g | ORAL_TABLET | Freq: Three times a day (TID) | ORAL | 0 refills | Status: AC

## 2024-01-13 MED ORDER — OMEPRAZOLE 20 MG PO CPDR: 20.0000 mg | DELAYED_RELEASE_CAPSULE | Freq: Every day | ORAL | 0 refills | Status: AC

## 2024-01-13 NOTE — ED Provider Notes (Signed)
[place] EMERGENCY DEPARTMENT AT [facility] Provider Note   CSN: [id number] Arrival date & time: 01/13/24  1125     History  Chief Complaint  Patient presents with   Abdominal Pain   Back Pain    Jonathan Michael is a 28 y.o. male.   Abdominal Pain Back Pain Associated symptoms: abdominal pain   Patient presents with epigastric pain.  Has had for the last almost month.  History of reported "ulcers".  However it sounds if he was never scoped.  This was done in [place].  Had been on some medicines for it.  Now worsening pain.  States pain tends to get better after eating although sometimes will worsen when eating.  No fevers or chills.  No blood in the stool or black stools.    Past Medical History:  Diagnosis Date   Ankle fracture    left    Home Medications Prior to Admission medications   Medication Sig Start Date End Date Taking? Authorizing Provider  albuterol (VENTOLIN HFA) 108 (90 Base) MCG/ACT inhaler Inhale 2 puffs into the lungs every 4 (four) hours as needed for wheezing or shortness of breath. 10/26/22   Particia Nearing, PA-C  azithromycin (ZITHROMAX) 250 MG tablet Take first 2 tablets together, then 1 every day until finished. 10/26/22   Particia Nearing, PA-C  cetirizine (ZYRTEC) 10 MG tablet Take 1 tablet (10 mg total) by mouth daily. 02/07/23   Leath-Warren, Sadie Haber, NP  fluticasone (FLONASE) 50 MCG/ACT nasal spray Place 2 sprays into both nostrils daily. 02/07/23   Leath-Warren, Sadie Haber, NP  lidocaine (XYLOCAINE) 2 % solution Use as directed 10 mLs in the mouth or throat every 3 (three) hours as needed for mouth pain. 02/04/23   Particia Nearing, PA-C  loperamide (IMODIUM) 2 MG capsule Take 1 capsule (2 mg total) by mouth 4 (four) times daily as needed for diarrhea or loose stools. 10/28/22   Gerhard Munch, MD  omeprazole (PRILOSEC) 20 MG capsule Take 1 capsule (20 mg total) by mouth daily. 01/13/24  Yes Benjiman Core,  MD  ondansetron (ZOFRAN-ODT) 4 MG disintegrating tablet Take 1 tablet (4 mg total) by mouth every 8 (eight) hours as needed for nausea or vomiting. 10/28/22   Gerhard Munch, MD  promethazine-dextromethorphan (PROMETHAZINE-DM) 6.25-15 MG/5ML syrup Take 5 mLs by mouth at bedtime as needed for cough. Do not take with alcohol or while driving or operating heavy machinery.  May cause drowsiness. 10/26/22   Particia Nearing, PA-C  promethazine-dextromethorphan (PROMETHAZINE-DM) 6.25-15 MG/5ML syrup Take 5 mLs by mouth 4 (four) times daily as needed. 02/04/23   Particia Nearing, PA-C  sucralfate (CARAFATE) 1 g tablet Take 1 tablet (1 g total) by mouth 4 (four) times daily -  with meals and at bedtime for 14 days. 01/13/24 01/27/24 Yes Benjiman Core, MD      Allergies    Patient has no known allergies.    Review of Systems   Review of Systems  Gastrointestinal:  Positive for abdominal pain.  Musculoskeletal:  Positive for back pain.    Physical Exam Updated Vital Signs BP 138/84 (BP Location: Left Arm)   Pulse 69   Temp 98 F (36.7 C) (Oral)   Resp 18   Ht 6\' 1"  (1.854 m)   Wt 106.6 kg   SpO2 99%   BMI 31.00 kg/m  Physical Exam Vitals and nursing note reviewed.  Cardiovascular:     Rate and Rhythm: Normal rate.  Abdominal:     Tenderness: There is no abdominal tenderness.  Skin:    Capillary Refill: Capillary refill takes less than 2 seconds.  Neurological:     Mental Status: He is alert.     ED Results / Procedures / Treatments   Labs (all labs ordered are listed, but only abnormal results are displayed) Labs Reviewed  COMPREHENSIVE METABOLIC PANEL - Abnormal; Notable for the following components:      Result Value   Glucose, Bld 108 (*)    All other components within normal limits  LIPASE, BLOOD  CBC  URINALYSIS, ROUTINE W REFLEX MICROSCOPIC    EKG None  Radiology No results found.  Procedures Procedures    Medications Ordered in ED Medications  - No data to display  ED Course/ Medical Decision Making/ A&P                                 Medical Decision Making Amount and/or Complexity of Data Reviewed Labs: ordered.  Risk Prescription drug management.   Patient with abdominal pain.  Epigastric.  Differential diagnoses long but includes causes such as biliary sees, gastritis, ulcers.  Other causes such as pancreatitis considered but normal lipase.  Questionable history of ulcers although it sounds if he was never scoped.  However this does somewhat fit in as a cause.  Also potential gastritis.  Will start on Prilosec and Carafate.  Blood work reassuring.  Will have follow-up with gastroenterology.  Appears stable for discharge home.        Final Clinical Impression(s) / ED Diagnoses Final diagnoses:  Epigastric pain    Rx / DC Orders ED Discharge Orders          Ordered    omeprazole (PRILOSEC) 20 MG capsule  Daily        01/13/24 1551    sucralfate (CARAFATE) 1 g tablet  3 times daily with meals & bedtime        01/13/24 1551              Benjiman Core, MD 01/13/24 1553

## 2024-01-13 NOTE — ED Triage Notes (Signed)
Pt complaining of intermittent stomach and back pain that has been present since christmas. Pt does report an increase in pain to an unbearable level starting yesterday. Denies any N/V/D.

## 2024-01-13 NOTE — Discharge Instructions (Signed)
Follow-up with gastroenterology

## 2024-03-22 ENCOUNTER — Ambulatory Visit
Admission: RE | Admit: 2024-03-22 | Discharge: 2024-03-22 | Disposition: A | Discharge status: Home / Self Care | Source: Ambulatory Visit | Attending: Family Medicine | Admitting: Family Medicine

## 2024-03-22 VITALS — BP 132/85 | HR 83 | Temp 97.9°F | Resp 18

## 2024-03-22 DIAGNOSIS — J22 Unspecified acute lower respiratory infection: Secondary | ICD-10-CM | POA: Diagnosis not present

## 2024-03-22 MED ORDER — PREDNISONE 20 MG PO TABS: 40.0000 mg | ORAL_TABLET | Freq: Every day | ORAL | 0 refills | Status: AC

## 2024-03-22 MED ORDER — PROMETHAZINE-DM 6.25-15 MG/5ML PO SYRP: 5.0000 mL | ORAL_SOLUTION | Freq: Four times a day (QID) | ORAL | 0 refills | Status: AC | PRN

## 2024-03-22 MED ORDER — AZITHROMYCIN 250 MG PO TABS: ORAL_TABLET | ORAL | 0 refills | Status: AC

## 2024-03-22 MED ORDER — ALBUTEROL SULFATE HFA 108 (90 BASE) MCG/ACT IN AERS: 2.0000 | INHALATION_SPRAY | RESPIRATORY_TRACT | 0 refills | Status: AC | PRN

## 2024-03-22 NOTE — ED Triage Notes (Signed)
 Pt reports he has a cough, fever, and throat pain x 1 week

## 2024-03-22 NOTE — ED Provider Notes (Signed)
 [facility]    CSN: [id number] Arrival date & time: 03/22/24  1840      History   Chief Complaint Chief Complaint  Patient presents with   Cough    Entered by patient    HPI Jonathan Michael is a 28 y.o. male.   Patient presenting today with progressively worsening productive cough, congestion, sore throat, intermittent fevers, chest tightness.  Denies chest pain, abdominal pain, vomiting, diarrhea.  So far trying Mucinex with minimal relief.  No known history of chronic pulmonary disease.  Son recently diagnosed with pneumonia with similar symptoms.    Past Medical History:  Diagnosis Date   Ankle fracture    left    There are no active problems to display for this patient.   History reviewed. No pertinent surgical history.     Home Medications    Prior to Admission medications   Medication Sig Start Date End Date Taking? Authorizing Provider  predniSONE (DELTASONE) 20 MG tablet Take 2 tablets (40 mg total) by mouth daily with breakfast. 03/22/24  Yes Particia Nearing, PA-C  promethazine-dextromethorphan (PROMETHAZINE-DM) 6.25-15 MG/5ML syrup Take 5 mLs by mouth 4 (four) times daily as needed. 03/22/24  Yes Particia Nearing, PA-C  albuterol (VENTOLIN HFA) 108 (90 Base) MCG/ACT inhaler Inhale 2 puffs into the lungs every 4 (four) hours as needed for wheezing or shortness of breath. 03/22/24   Particia Nearing, PA-C  azithromycin (ZITHROMAX) 250 MG tablet Take first 2 tablets together, then 1 every day until finished. 03/22/24   Particia Nearing, PA-C  cetirizine (ZYRTEC) 10 MG tablet Take 1 tablet (10 mg total) by mouth daily. 02/07/23   Leath-Warren, Sadie Haber, NP  fluticasone (FLONASE) 50 MCG/ACT nasal spray Place 2 sprays into both nostrils daily. 02/07/23   Leath-Warren, Sadie Haber, NP  lidocaine (XYLOCAINE) 2 % solution Use as directed 10 mLs in the mouth or throat every 3 (three) hours as needed for mouth pain. 02/04/23   Particia Nearing, PA-C  loperamide (IMODIUM) 2 MG capsule Take 1 capsule (2 mg total) by mouth 4 (four) times daily as needed for diarrhea or loose stools. 10/28/22   Gerhard Munch, MD  omeprazole (PRILOSEC) 20 MG capsule Take 1 capsule (20 mg total) by mouth daily. 01/13/24   Benjiman Core, MD  ondansetron (ZOFRAN-ODT) 4 MG disintegrating tablet Take 1 tablet (4 mg total) by mouth every 8 (eight) hours as needed for nausea or vomiting. 10/28/22   Gerhard Munch, MD  promethazine-dextromethorphan (PROMETHAZINE-DM) 6.25-15 MG/5ML syrup Take 5 mLs by mouth at bedtime as needed for cough. Do not take with alcohol or while driving or operating heavy machinery.  May cause drowsiness. 10/26/22   Particia Nearing, PA-C  promethazine-dextromethorphan (PROMETHAZINE-DM) 6.25-15 MG/5ML syrup Take 5 mLs by mouth 4 (four) times daily as needed. 02/04/23   Particia Nearing, PA-C  sucralfate (CARAFATE) 1 g tablet Take 1 tablet (1 g total) by mouth 4 (four) times daily -  with meals and at bedtime for 14 days. 01/13/24 01/27/24  Benjiman Core, MD    Family History History reviewed. No pertinent family history.  Social History Social History   Tobacco Use   Smoking status: Never   Smokeless tobacco: Never  Vaping Use   Vaping status: Never Used  Substance Use Topics   Alcohol use: Never   Drug use: Never     Allergies   Patient has no known allergies.   Review of Systems Review of Systems  Per HPI  Physical Exam Triage Vital Signs ED Triage Vitals [03/22/24 1846]  Encounter Vitals Group     BP 132/85     Systolic BP Percentile      Diastolic BP Percentile      Pulse Rate 83     Resp 18     Temp 97.9 F (36.6 C)     Temp Source Oral     SpO2 93 %     Weight      Height      Head Circumference      Peak Flow      Pain Score 0     Pain Loc      Pain Education      Exclude from Growth Chart    No data found.  Updated Vital Signs BP 132/85 (BP Location: Right Arm)    Pulse 83   Temp 97.9 F (36.6 C) (Oral)   Resp 18   SpO2 93%   Visual Acuity Right Eye Distance:   Left Eye Distance:   Bilateral Distance:    Right Eye Near:   Left Eye Near:    Bilateral Near:     Physical Exam Vitals and nursing note reviewed.  Constitutional:      Appearance: He is well-developed.  HENT:     Head: Atraumatic.     Right Ear: External ear normal.     Left Ear: External ear normal.     Nose: Congestion present.     Mouth/Throat:     Pharynx: Posterior oropharyngeal erythema present. No oropharyngeal exudate.  Eyes:     Conjunctiva/sclera: Conjunctivae normal.     Pupils: Pupils are equal, round, and reactive to light.  Cardiovascular:     Rate and Rhythm: Normal rate and regular rhythm.  Pulmonary:     Effort: Pulmonary effort is normal. No respiratory distress.     Breath sounds: No wheezing.  Musculoskeletal:        General: Normal range of motion.     Cervical back: Normal range of motion and neck supple.  Lymphadenopathy:     Cervical: No cervical adenopathy.  Skin:    General: Skin is warm and dry.  Neurological:     Mental Status: He is alert and oriented to person, place, and time.  Psychiatric:        Behavior: Behavior normal.      UC Treatments / Results  Labs (all labs ordered are listed, but only abnormal results are displayed) Labs Reviewed - No data to display  EKG   Radiology No results found.  Procedures Procedures (including critical care time)  Medications Ordered in UC Medications - No data to display  Initial Impression / Assessment and Plan / UC Course  I have reviewed the triage vital signs and the nursing notes.  Pertinent labs & imaging results that were available during my care of the patient were reviewed by me and considered in my medical decision making (see chart for details).     Overall exam and vitals reassuring, however given duration and worsening course will treat with Zithromax, prednisone,  albuterol, Phenergan DM.  Discussed supportive over-the-counter medications and home care.  Return for worsening symptoms.  Final Clinical Impressions(s) / UC Diagnoses   Final diagnoses:  Lower respiratory infection   Discharge Instructions   None    ED Prescriptions     Medication Sig Dispense Auth. Provider   predniSONE (DELTASONE) 20 MG tablet Take 2 tablets (40 mg total)  by mouth daily with breakfast. 10 tablet Particia Nearing, PA-C   azithromycin (ZITHROMAX) 250 MG tablet Take first 2 tablets together, then 1 every day until finished. 6 tablet Particia Nearing, [place]   albuterol (VENTOLIN HFA) 108 (90 Base) MCG/ACT inhaler Inhale 2 puffs into the lungs every 4 (four) hours as needed for wheezing or shortness of breath. 18 g Roosvelt Maser [place], [place]   promethazine-dextromethorphan (PROMETHAZINE-DM) 6.25-15 MG/5ML syrup Take 5 mLs by mouth 4 (four) times daily as needed. 100 mL Particia Nearing, [place]      PDMP not reviewed this encounter.   Particia Nearing, [place] 03/22/24 1927

## 2024-06-01 ENCOUNTER — Other Ambulatory Visit: Payer: Self-pay

## 2024-06-01 ENCOUNTER — Emergency Department (HOSPITAL_BASED_OUTPATIENT_CLINIC_OR_DEPARTMENT_OTHER)
Admission: EM | Admit: 2024-06-01 | Discharge: 2024-06-01 | Disposition: A | Discharge status: Home / Self Care | Payer: Worker's Compensation

## 2024-06-01 ENCOUNTER — Emergency Department (HOSPITAL_BASED_OUTPATIENT_CLINIC_OR_DEPARTMENT_OTHER): Admitting: Radiology

## 2024-06-01 ENCOUNTER — Encounter (HOSPITAL_BASED_OUTPATIENT_CLINIC_OR_DEPARTMENT_OTHER): Payer: Self-pay | Admitting: Emergency Medicine

## 2024-06-01 DIAGNOSIS — S70311A Abrasion, right thigh, initial encounter: Secondary | ICD-10-CM | POA: Diagnosis not present

## 2024-06-01 DIAGNOSIS — Y990 Civilian activity done for income or pay: Secondary | ICD-10-CM | POA: Insufficient documentation

## 2024-06-01 DIAGNOSIS — W010XXA Fall on same level from slipping, tripping and stumbling without subsequent striking against object, initial encounter: Secondary | ICD-10-CM | POA: Insufficient documentation

## 2024-06-01 DIAGNOSIS — S79921A Unspecified injury of right thigh, initial encounter: Secondary | ICD-10-CM | POA: Diagnosis present

## 2024-06-01 DIAGNOSIS — M25572 Pain in left ankle and joints of left foot: Secondary | ICD-10-CM | POA: Diagnosis not present

## 2024-06-01 NOTE — Discharge Instructions (Addendum)
 You were seen today for left ankle pain after a fall.  The x-ray today did not show any signs of fracture today.  Ace bandage was provided.  Recommend you continue to rest, ice the area, compress it and elevate to help prevent swelling and reduce pain.  You can also use Tylenol and ibuprofen additionally for further pain relief.  Recommend you follow-up with orthopedics if you continue to have any weakness, instability or persistent pain.  Take Tylenol (acetominophen)  650mg  every 4-6 hours, as needed for pain or fever. Do not take more than 4,000 mg in a 24-hour period. As this may cause liver damage. While this is rare, if you begin to develop yellowing of the skin or eyes, stop taking and return to ER immediately. Take Ibuprofen 400mg  every 4-6 hours for pain or fever, not exceeding 3,200 mg per day as more than 3,200mg  can cause Stomach irritation, dizziness, kidney issues with long-term use.

## 2024-06-01 NOTE — ED Triage Notes (Signed)
 Pt caox4 c/o pain in R thigh, L hip and L ankle reporting at work his leg slipped through a gap in where he was walking causing him to fall. Pt denies hitting his head and denies LOC.

## 2024-06-01 NOTE — ED Provider Notes (Signed)
 [place] [facility] AT [facility] Provider Note   CSN: [id number] Arrival date & time: 06/01/24  0857     History  Chief Complaint  Patient presents with   Ankle Pain    [name] is a 28 y.o. male.   Ankle Pain Patient is a 28 year old male presents ED today with complaints of left ankle pain after fall at work.  Notes that he was unloading a truck when his ankle slipped into the gap between the baby and the truck.  Notes that he fell into the Up to his hip on the left side.  He was able to immediately get up and limp over to a chair where he then sat down and passed out, witnessed to have been out for approximately 2 seconds.  No signs of seizure.  And patient and family both report that he regularly does this after injuries.  Denies head injury, numbness, weakness, tingling, tongue bites, urinary incontinence, vision changes, headache, vomiting, abdominal pain, chest pain, shortness of breath.     Home Medications Prior to Admission medications   Medication Sig Start Date End Date Taking? Authorizing Provider  albuterol  (VENTOLIN  HFA) 108 (90 Base) MCG/ACT inhaler Inhale 2 puffs into the lungs every 4 (four) hours as needed for wheezing or shortness of breath. 03/22/24   Corbin Dess, PA-C  azithromycin  (ZITHROMAX ) 250 MG tablet Take first 2 tablets together, then 1 every day until finished. 03/22/24   Corbin Dess, PA-C  cetirizine  (ZYRTEC ) 10 MG tablet Take 1 tablet (10 mg total) by mouth daily. 02/07/23   Leath-Warren, Belen Bowers, NP  fluticasone  (FLONASE ) 50 MCG/ACT nasal spray Place 2 sprays into both nostrils daily. 02/07/23   Leath-Warren, Belen Bowers, NP  lidocaine  (XYLOCAINE ) 2 % solution Use as directed 10 mLs in the mouth or throat every 3 (three) hours as needed for mouth pain. 02/04/23   Corbin Dess, PA-C  loperamide  (IMODIUM ) 2 MG capsule Take 1 capsule (2 mg total) by mouth 4 (four) times daily as needed for  diarrhea or loose stools. 10/28/22   Dorenda Gandy, MD  omeprazole  (PRILOSEC) 20 MG capsule Take 1 capsule (20 mg total) by mouth daily. 01/13/24   Mozell Arias, MD  ondansetron  (ZOFRAN -ODT) 4 MG disintegrating tablet Take 1 tablet (4 mg total) by mouth every 8 (eight) hours as needed for nausea or vomiting. 10/28/22   Dorenda Gandy, MD  predniSONE  (DELTASONE ) 20 MG tablet Take 2 tablets (40 mg total) by mouth daily with breakfast. 03/22/24   Corbin Dess, PA-C  promethazine -dextromethorphan (PROMETHAZINE -DM) 6.25-15 MG/5ML syrup Take 5 mLs by mouth at bedtime as needed for cough. Do not take with alcohol or while driving or operating heavy machinery.  May cause drowsiness. 10/26/22   Corbin Dess, PA-C  promethazine -dextromethorphan (PROMETHAZINE -DM) 6.25-15 MG/5ML syrup Take 5 mLs by mouth 4 (four) times daily as needed. 02/04/23   Corbin Dess, PA-C  promethazine -dextromethorphan (PROMETHAZINE -DM) 6.25-15 MG/5ML syrup Take 5 mLs by mouth 4 (four) times daily as needed. 03/22/24   Corbin Dess, PA-C  sucralfate  (CARAFATE ) 1 g tablet Take 1 tablet (1 g total) by mouth 4 (four) times daily -  with meals and at bedtime for 14 days. 01/13/24 01/27/24  Mozell Arias, MD      Allergies    Patient has no known allergies.    Review of Systems   Review of Systems  Musculoskeletal:  Positive for arthralgias.  All other systems reviewed and are negative.  Physical Exam Updated Vital Signs BP (!) 116/91 (BP Location: Left Arm)   Pulse 63   Temp 97.6 F (36.4 C) (Oral)   Resp 16   Ht 6' 1 (1.854 m)   Wt 106.6 kg   SpO2 99%   BMI 31.00 kg/m  Physical Exam Vitals and nursing note reviewed.  Constitutional:      General: He is not in acute distress.    Appearance: Normal appearance. He is not ill-appearing or diaphoretic.  HENT:     Head: Normocephalic and atraumatic.  Eyes:     General: No scleral icterus.       Right eye: No discharge.         Left eye: No discharge.     Extraocular Movements: Extraocular movements intact.     Conjunctiva/sclera: Conjunctivae normal.  Cardiovascular:     Rate and Rhythm: Normal rate and regular rhythm.     Pulses: Normal pulses.     Heart sounds: Normal heart sounds. No murmur heard.    No friction rub. No gallop.  Pulmonary:     Effort: Pulmonary effort is normal. No respiratory distress.     Breath sounds: Normal breath sounds. No stridor. No wheezing, rhonchi or rales.  Chest:     Chest wall: No tenderness.  Abdominal:     General: Abdomen is flat. There is no distension.     Palpations: Abdomen is soft.     Tenderness: There is no abdominal tenderness. There is no right CVA tenderness, left CVA tenderness or guarding.  Musculoskeletal:        General: Tenderness (Tenderness noted to the left lateral malleolus) and signs of injury (Abrasions noted to right medial thigh) present. No swelling or deformity.     Cervical back: Normal range of motion and neck supple. No rigidity or tenderness.     Right lower leg: No edema.     Left lower leg: No edema.  Skin:    General: Skin is warm and dry.     Capillary Refill: Capillary refill takes less than 2 seconds.     Findings: No bruising, erythema, lesion or rash.     Comments: DP pulse 2+ on left side  Neurological:     General: No focal deficit present.     Mental Status: He is alert and oriented to person, place, and time. Mental status is at baseline.     Sensory: No sensory deficit.     Motor: No weakness.     Gait: Gait normal.  Psychiatric:        Mood and Affect: Mood normal.     ED Results / Procedures / Treatments   Labs (all labs ordered are listed, but only abnormal results are displayed) Labs Reviewed - No data to display  EKG None  Radiology DG Ankle Complete Left Result Date: 06/01/2024 CLINICAL DATA:  Left ankle pain after fall. EXAM: LEFT ANKLE COMPLETE - 3+ VIEW COMPARISON:  None Available. FINDINGS: There is no  evidence of fracture, dislocation, or joint effusion. There is no evidence of arthropathy or other focal bone abnormality. Soft tissues are unremarkable. IMPRESSION: Negative. Electronically Signed   By: Rosalene Colon M.D.   On: 06/01/2024 10:22    Procedures Procedures    Medications Ordered in ED Medications - No data to display  ED Course/ Medical Decision Making/ A&P  Medical Decision Making Amount and/or Complexity of Data Reviewed Radiology: ordered.   This patient is a 28 year old male who presents to the ED for concern of left ankle pain post mechanical fall earlier today, noting episode of syncope however with reported history of having this happen after injury, do not believe this needs further workup at this time, family agrees.  On physical exam, patient is in no acute distress, afebrile, alert and orient x 4, speaking in full sentences, nontachypneic, nontachycardic.  Patient has notable tenderness over the left lateral malleolus without any signs of ecchymosis or abrasions.  No foot tenderness.  Range of motion of the ankle is limited.  Normal sensation.  DP pulse 2+.  Abrasions noted to the right medial thigh.  Exam is otherwise unremarkable.  X-ray of left ankle showed no acute fracture.  Ace bandage was provide, will have patient continue to use ibuprofen and Tylenol for pain control.  And follow-up with orthopedic surgery for any persistent symptoms.  Patient vital signs have remained stable throughout the course of patient's time in the ED. Low suspicion for any other emergent pathology at this time. I believe this patient is safe to be discharged. Provided strict return to ER precautions. Patient expressed agreement and understanding of plan. All questions were answered.  Differential diagnoses prior to evaluation: The emergent differential diagnosis includes, but is not limited to, fracture, ligamentous injury, neurovascular injury,  dislocation, malalignment. This is not an exhaustive differential.   Past Medical History / Co-morbidities / Social History: Left ankle fracture  Additional history: Chart reviewed. Pertinent results include:   3 ED trips in the last 6 months   Lab Tests/Imaging studies: I personally interpreted labs/imaging and the pertinent results include:    X-ray of left ankle was negative.   I agree with the radiologist interpretation.    Medications:  I have reviewed the patients home medicines and have made adjustments as needed.  Critical Interventions: none  Social Determinants of Health: none  Disposition: After consideration of the diagnostic results and the patients response to treatment, I feel that the patient would benefit from discharge and treatment as above.   emergency department workup does not suggest an emergent condition requiring admission or immediate intervention beyond what has been performed at this time. The plan is: Follow-up with PCP for any persistent symptoms or with orthopedic surgery, return for any new or worsening symptoms, RICE,. The patient is safe for discharge and has been instructed to return immediately for worsening symptoms, change in symptoms or any other concerns.    Final Clinical Impression(s) / ED Diagnoses Final diagnoses:  Acute left ankle pain    Rx / DC Orders ED Discharge Orders     None         Tylisa Alcivar S, PA-C 06/01/24 1059    Carin Charleston, MD 06/01/24 1457

## 2024-06-01 NOTE — ED Notes (Signed)
 Discharge instructions, follow up care, and pain management reviewed and explained, pt verbalized understanding with no further questions on d/c.
